# Patient Record
Sex: Female | Born: 1970 | Race: Black or African American | Hispanic: No | Smoking: Current every day smoker
Health system: Southern US, Community
[De-identification: ages and names within clinical notes are randomized; demographics above are authoritative.]

## PROBLEM LIST (undated history)

## (undated) DIAGNOSIS — M1990 Unspecified osteoarthritis, unspecified site: Secondary | ICD-10-CM

## (undated) DIAGNOSIS — E785 Hyperlipidemia, unspecified: Secondary | ICD-10-CM

## (undated) DIAGNOSIS — D509 Iron deficiency anemia, unspecified: Secondary | ICD-10-CM

## (undated) DIAGNOSIS — I10 Essential (primary) hypertension: Secondary | ICD-10-CM

## (undated) DIAGNOSIS — E119 Type 2 diabetes mellitus without complications: Secondary | ICD-10-CM

## (undated) HISTORY — PX: ABDOMINAL HYSTERECTOMY: SHX81

## (undated) HISTORY — PX: LAPAROSCOPIC GASTRIC BAND REMOVAL WITH LAPAROSCOPIC GASTRIC SLEEVE RESECTION: SHX6498

## (undated) HISTORY — PX: FOOT SURGERY: SHX648

---

## 2005-02-28 ENCOUNTER — Emergency Department: Payer: Self-pay | Admitting: Emergency Medicine

## 2006-08-21 ENCOUNTER — Emergency Department: Payer: Self-pay | Admitting: Emergency Medicine

## 2006-08-21 ENCOUNTER — Other Ambulatory Visit: Payer: Self-pay

## 2006-09-17 ENCOUNTER — Emergency Department: Payer: Self-pay

## 2006-11-01 ENCOUNTER — Emergency Department: Payer: Self-pay | Admitting: Emergency Medicine

## 2007-02-21 ENCOUNTER — Inpatient Hospital Stay: Payer: Self-pay | Admitting: Internal Medicine

## 2007-02-21 ENCOUNTER — Other Ambulatory Visit: Payer: Self-pay

## 2010-05-26 ENCOUNTER — Emergency Department: Payer: Self-pay | Admitting: Emergency Medicine

## 2010-10-07 ENCOUNTER — Inpatient Hospital Stay: Payer: Self-pay | Admitting: Surgery

## 2010-10-13 LAB — PATHOLOGY REPORT

## 2013-09-11 ENCOUNTER — Emergency Department: Payer: Self-pay | Admitting: Emergency Medicine

## 2013-09-11 LAB — URINALYSIS, COMPLETE
Bacteria: NONE SEEN
Bilirubin,UR: NEGATIVE
Blood: NEGATIVE
Ketone: NEGATIVE
Ph: 6 (ref 4.5–8.0)
Protein: NEGATIVE

## 2013-09-11 LAB — CBC
HCT: 39.4 % (ref 35.0–47.0)
HGB: 12.8 g/dL (ref 12.0–16.0)
MCH: 23.8 pg — ABNORMAL LOW (ref 26.0–34.0)
MCHC: 32.4 g/dL (ref 32.0–36.0)
MCV: 74 fL — ABNORMAL LOW (ref 80–100)
Platelet: 340 10*3/uL (ref 150–440)
RDW: 18.1 % — ABNORMAL HIGH (ref 11.5–14.5)
WBC: 10.7 10*3/uL (ref 3.6–11.0)

## 2013-09-11 LAB — COMPREHENSIVE METABOLIC PANEL
Alkaline Phosphatase: 90 U/L (ref 50–136)
BUN: 11 mg/dL (ref 7–18)
Bilirubin,Total: 0.2 mg/dL (ref 0.2–1.0)
Co2: 27 mmol/L (ref 21–32)
EGFR (African American): >60
Glucose: 115 mg/dL — ABNORMAL HIGH (ref 65–99)
Osmolality: 276 (ref 275–301)
SGOT(AST): 38 U/L — ABNORMAL HIGH (ref 15–37)
Sodium: 138 mmol/L (ref 136–145)

## 2013-09-11 LAB — LIPASE, BLOOD: Lipase: 208 U/L (ref 73–393)

## 2014-09-03 ENCOUNTER — Emergency Department: Payer: Self-pay | Admitting: Emergency Medicine

## 2015-05-20 ENCOUNTER — Other Ambulatory Visit: Payer: Self-pay

## 2015-05-20 ENCOUNTER — Other Ambulatory Visit (HOSPITAL_COMMUNITY)
Admission: RE | Admit: 2015-05-20 | Discharge: 2015-05-20 | Disposition: A | Discharge status: Home / Self Care | Payer: 59 | Source: Ambulatory Visit | Attending: Obstetrics & Gynecology | Admitting: Obstetrics & Gynecology

## 2015-05-20 ENCOUNTER — Other Ambulatory Visit: Payer: Self-pay | Admitting: Obstetrics & Gynecology

## 2015-05-20 DIAGNOSIS — Z1151 Encounter for screening for human papillomavirus (HPV): Secondary | ICD-10-CM | POA: Diagnosis present

## 2015-05-20 DIAGNOSIS — Z01419 Encounter for gynecological examination (general) (routine) without abnormal findings: Secondary | ICD-10-CM | POA: Diagnosis present

## 2015-05-20 DIAGNOSIS — Z1231 Encounter for screening mammogram for malignant neoplasm of breast: Secondary | ICD-10-CM

## 2015-05-21 LAB — CYTOLOGY - PAP

## 2015-05-29 ENCOUNTER — Ambulatory Visit
Admission: RE | Admit: 2015-05-29 | Discharge: 2015-05-29 | Disposition: A | Discharge status: Home / Self Care | Payer: 59 | Source: Ambulatory Visit

## 2015-05-29 DIAGNOSIS — Z1231 Encounter for screening mammogram for malignant neoplasm of breast: Secondary | ICD-10-CM

## 2016-05-14 ENCOUNTER — Other Ambulatory Visit: Payer: Self-pay | Admitting: Obstetrics & Gynecology

## 2016-05-14 DIAGNOSIS — Z1231 Encounter for screening mammogram for malignant neoplasm of breast: Secondary | ICD-10-CM

## 2016-05-28 ENCOUNTER — Other Ambulatory Visit: Payer: Self-pay | Admitting: Obstetrics & Gynecology

## 2016-05-31 ENCOUNTER — Ambulatory Visit
Admission: RE | Admit: 2016-05-31 | Discharge: 2016-05-31 | Disposition: A | Discharge status: Home / Self Care | Payer: 59 | Source: Ambulatory Visit | Attending: Obstetrics & Gynecology | Admitting: Obstetrics & Gynecology

## 2016-05-31 DIAGNOSIS — Z1231 Encounter for screening mammogram for malignant neoplasm of breast: Secondary | ICD-10-CM

## 2016-09-06 ENCOUNTER — Ambulatory Visit: Payer: 59 | Admitting: Cardiovascular Disease

## 2016-09-06 ENCOUNTER — Encounter: Payer: Self-pay | Admitting: *Deleted

## 2017-05-12 ENCOUNTER — Other Ambulatory Visit: Payer: Self-pay | Admitting: Obstetrics & Gynecology

## 2017-05-12 DIAGNOSIS — Z1231 Encounter for screening mammogram for malignant neoplasm of breast: Secondary | ICD-10-CM

## 2017-06-01 ENCOUNTER — Ambulatory Visit: Payer: 59

## 2017-06-01 ENCOUNTER — Ambulatory Visit
Admission: RE | Admit: 2017-06-01 | Discharge: 2017-06-01 | Disposition: A | Discharge status: Home / Self Care | Payer: 59 | Source: Ambulatory Visit | Attending: Obstetrics & Gynecology | Admitting: Obstetrics & Gynecology

## 2017-06-01 DIAGNOSIS — Z1231 Encounter for screening mammogram for malignant neoplasm of breast: Secondary | ICD-10-CM

## 2018-03-15 ENCOUNTER — Encounter: Payer: 59 | Attending: Family Medicine | Admitting: Dietician

## 2018-03-15 ENCOUNTER — Encounter: Payer: Self-pay | Admitting: Dietician

## 2018-03-15 VITALS — Ht 69.0 in | Wt >= 6400 oz

## 2018-03-15 DIAGNOSIS — I10 Essential (primary) hypertension: Secondary | ICD-10-CM | POA: Diagnosis not present

## 2018-03-15 DIAGNOSIS — Z6841 Body Mass Index (BMI) 40.0 and over, adult: Secondary | ICD-10-CM | POA: Diagnosis not present

## 2018-03-15 DIAGNOSIS — Z713 Dietary counseling and surveillance: Secondary | ICD-10-CM | POA: Diagnosis not present

## 2018-03-15 DIAGNOSIS — R739 Hyperglycemia, unspecified: Secondary | ICD-10-CM | POA: Insufficient documentation

## 2018-03-15 NOTE — Patient Instructions (Signed)
   Work on controlling portions of starchy foods-- keep to 1 cup (fist-size) or less. Try measuring portions, using smaller plates, eat slowly, take small bites.   Eat generous portions of low-carb vegetables, and some fruits.   Keep portion of nuts/ trail mix to 1/4 cup ideally. Use fruit, yogurt, lowfat cheese, whole grain crackers/ graham crackers for snacks. Pre-portion snacks and put the rest away.   Eat meals on a more consistent basis. Keep easy, healthy options on hand for busy days.

## 2018-03-15 NOTE — Progress Notes (Signed)
Medical Nutrition Therapy: Visit start time: 1630  end time: 1800  Assessment:  Diagnosis: obesity, hyperglycemia, HTN Past medical history: chronic knee pain, needs knee replacement per patient Psychosocial issues/ stress concerns: high level of personal stress caring for visually impaired mother, 3 young grandchildren and son living in home; working 2 jobs  Preferred learning method:  [place] . Hands-on  Current weight: 409.5lbs Height: 5'9" Medications, supplements: reconciled list in medical record  Progress and evaluation: Patient has worked on weight loss about 48yrs ago by exercise, avoiding restaurant meals --until hurting knee after 5K, and has since regained weight. She reports a very busy schedule in helping raise 3 grandchildren with her son, and mother, + working 2 jobs. She needs to lose weight in order to have knee replacement surgery, and is considering weight loss surgery if she can get insurance coverage. She seeks help with appropriate weight loss diet that can work for her lifestyle without having to cook multiple meals for family members.     Physical activity: none recently due to knee pain and busy schedule  Dietary Intake:  Usual eating pattern includes 2-3 meals and 1-2 snacks per day. Dining out frequency: 4-5 meals per week.  Breakfast: 2 boiled eggs, occ with small amount mayo, 2 slices bacon, banana, plain oatmeal with small amount honey; sometimes skips; sometimes cereal or chips--on the go.  Snack: usually none Lunch: sometimes skips; leftovers; sandwich; lean cuisine meal Snack: trail mix with m&ms, peanuts, peanut butter chips, raisins; chips and dip; tortilla chips and salsa Supper: spaghetti; baked chicken breast; hot dogs, burger; pizza -- cooks for mother, grandchildren Snack: sometimes (same foods as pm snack) Beverages: water, 1c coffee with sugar, creamer; sweet tea 1-2x a week; occasional soda (does not like diet)  Nutrition Care Education: Topics  covered: weight control, diabetes prevention, HTN Basic nutrition: basic food groups, appropriate nutrient balance, appropriate meal and snack schedule, general nutrition guidelines    Weight control: appropriate kcal intake for weight loss estimated at 1700-1800kcal-- provided guidance for food portions, basic meal planning using plate method, and balanced meal options; importance of low fat and low sugar choices; increasing vegetable and fruit intake; healthy snack options; benefits of tracking food intake; role of exercise/ physical activity Diabetes:  appropriate meal and snack schedule, appropriate carb intake and balance Hypertension: identifying high sodium foods, identifying food sources of potassium, magnesium; low sodium seasoning options   Nutritional Diagnosis:  [place]-2.2 Altered nutrition-related laboratory As related to hyperglycemia.  As evidenced by recent HbA1C of 6.8%. [place]-3.3 Overweight/obesity As related to excess calories and inactivity due to knee pain.  As evidenced by patient with BMI of 60, diet history of large food portions.  Intervention: Instruction as noted above.   Patient has begun making diet changes to decrease intake of sugar-sweetened beverages, fast foods, and fried foods.    Set additional diet goals with direction from patient.        Education Materials given:  . Plate Planner with food lists . Sample meal pattern/ menus . Snacking handout . Goals/ instructions  Learner/ who was taught:  . Patient   Level of understanding: Marland Kitchen Verbalizes/ demonstrates competency  Demonstrated degree of understanding via:   Teach back Learning barriers: . None  Willingness to learn/ readiness for change: . Eager, change in progress  Monitoring and Evaluation:  Dietary intake, exercise, and body weight      follow up: 04/26/18

## 2018-04-24 ENCOUNTER — Other Ambulatory Visit: Payer: Self-pay | Admitting: Obstetrics & Gynecology

## 2018-04-24 DIAGNOSIS — Z1231 Encounter for screening mammogram for malignant neoplasm of breast: Secondary | ICD-10-CM

## 2018-04-26 ENCOUNTER — Ambulatory Visit: Payer: 59 | Admitting: Dietician

## 2018-05-05 ENCOUNTER — Telehealth: Payer: Self-pay | Admitting: Dietician

## 2018-05-05 NOTE — Telephone Encounter (Signed)
Called patient to reschedule appointment which was missed on 04/26/18. Rescheduled for 06/05/18 at 4:30pm. Patient reports making some diet changes and doing well at this time.

## 2018-06-05 ENCOUNTER — Ambulatory Visit: Payer: 59 | Admitting: Dietician

## 2018-06-05 ENCOUNTER — Ambulatory Visit
Admission: RE | Admit: 2018-06-05 | Discharge: 2018-06-05 | Disposition: A | Discharge status: Home / Self Care | Payer: 59 | Source: Ambulatory Visit | Attending: Obstetrics & Gynecology | Admitting: Obstetrics & Gynecology

## 2018-06-05 ENCOUNTER — Telehealth: Payer: Self-pay | Admitting: Dietician

## 2018-06-05 DIAGNOSIS — Z1231 Encounter for screening mammogram for malignant neoplasm of breast: Secondary | ICD-10-CM

## 2018-06-05 NOTE — Telephone Encounter (Signed)
Called patient to reschedule her appointment from today to another time due to scheduling conflict in our office. She rescheduled to 06/14/18 at 4:30pm.

## 2018-06-14 ENCOUNTER — Ambulatory Visit: Payer: 59 | Admitting: Dietician

## 2018-06-20 ENCOUNTER — Encounter: Payer: Self-pay | Admitting: Dietician

## 2018-06-20 NOTE — Progress Notes (Signed)
Patient has missed 3 consecutively scheduled appointments. Sent discharge letter to referring provider.

## 2019-02-20 ENCOUNTER — Other Ambulatory Visit: Payer: Self-pay | Admitting: Family Medicine

## 2019-02-20 DIAGNOSIS — R7989 Other specified abnormal findings of blood chemistry: Secondary | ICD-10-CM

## 2019-02-20 DIAGNOSIS — R945 Abnormal results of liver function studies: Principal | ICD-10-CM

## 2019-03-02 ENCOUNTER — Ambulatory Visit
Admission: RE | Admit: 2019-03-02 | Discharge: 2019-03-02 | Disposition: A | Discharge status: Home / Self Care | Payer: 59 | Source: Ambulatory Visit | Attending: Family Medicine | Admitting: Family Medicine

## 2019-03-02 ENCOUNTER — Other Ambulatory Visit: Payer: Self-pay

## 2019-03-02 DIAGNOSIS — R945 Abnormal results of liver function studies: Secondary | ICD-10-CM | POA: Insufficient documentation

## 2019-03-02 DIAGNOSIS — R7989 Other specified abnormal findings of blood chemistry: Secondary | ICD-10-CM

## 2019-04-27 ENCOUNTER — Other Ambulatory Visit: Payer: Self-pay | Admitting: Obstetrics & Gynecology

## 2019-04-27 DIAGNOSIS — Z1231 Encounter for screening mammogram for malignant neoplasm of breast: Secondary | ICD-10-CM

## 2019-06-12 ENCOUNTER — Other Ambulatory Visit: Payer: Self-pay

## 2019-06-12 ENCOUNTER — Ambulatory Visit
Admission: RE | Admit: 2019-06-12 | Discharge: 2019-06-12 | Disposition: A | Discharge status: Home / Self Care | Payer: 59 | Source: Ambulatory Visit | Attending: Obstetrics & Gynecology | Admitting: Obstetrics & Gynecology

## 2019-06-12 DIAGNOSIS — Z1231 Encounter for screening mammogram for malignant neoplasm of breast: Secondary | ICD-10-CM

## 2019-09-17 ENCOUNTER — Other Ambulatory Visit: Payer: Self-pay | Admitting: Surgical Oncology

## 2019-09-17 DIAGNOSIS — K219 Gastro-esophageal reflux disease without esophagitis: Secondary | ICD-10-CM

## 2019-09-24 ENCOUNTER — Ambulatory Visit
Admission: RE | Admit: 2019-09-24 | Discharge: 2019-09-24 | Disposition: A | Discharge status: Home / Self Care | Payer: 59 | Source: Ambulatory Visit | Attending: Surgical Oncology | Admitting: Surgical Oncology

## 2019-09-24 DIAGNOSIS — K219 Gastro-esophageal reflux disease without esophagitis: Secondary | ICD-10-CM

## 2020-05-20 ENCOUNTER — Other Ambulatory Visit: Payer: Self-pay | Admitting: Family Medicine

## 2020-05-20 DIAGNOSIS — Z1231 Encounter for screening mammogram for malignant neoplasm of breast: Secondary | ICD-10-CM

## 2020-06-13 ENCOUNTER — Ambulatory Visit
Admission: RE | Admit: 2020-06-13 | Discharge: 2020-06-13 | Disposition: A | Discharge status: Home / Self Care | Payer: 59 | Source: Ambulatory Visit | Attending: Family Medicine | Admitting: Family Medicine

## 2020-06-13 ENCOUNTER — Other Ambulatory Visit: Payer: Self-pay

## 2020-06-13 DIAGNOSIS — Z1231 Encounter for screening mammogram for malignant neoplasm of breast: Secondary | ICD-10-CM

## 2020-07-14 ENCOUNTER — Other Ambulatory Visit
Admission: RE | Admit: 2020-07-14 | Discharge: 2020-07-14 | Disposition: A | Discharge status: Home / Self Care | Payer: 59 | Source: Ambulatory Visit | Attending: Internal Medicine | Admitting: Internal Medicine

## 2020-07-14 ENCOUNTER — Other Ambulatory Visit: Payer: Self-pay

## 2020-07-14 DIAGNOSIS — Z01812 Encounter for preprocedural laboratory examination: Secondary | ICD-10-CM | POA: Insufficient documentation

## 2020-07-14 DIAGNOSIS — Z20822 Contact with and (suspected) exposure to covid-19: Secondary | ICD-10-CM | POA: Insufficient documentation

## 2020-07-14 LAB — SARS CORONAVIRUS 2 (TAT 6-24 HRS): SARS Coronavirus 2: NEGATIVE

## 2020-07-15 ENCOUNTER — Encounter: Payer: Self-pay | Admitting: Internal Medicine

## 2020-07-16 ENCOUNTER — Encounter
Admission: RE | Disposition: A | Discharge status: Home / Self Care | Payer: Self-pay | Source: Home / Self Care | Attending: Internal Medicine

## 2020-07-16 ENCOUNTER — Ambulatory Visit: Payer: 59 | Admitting: Anesthesiology

## 2020-07-16 ENCOUNTER — Other Ambulatory Visit: Payer: Self-pay

## 2020-07-16 ENCOUNTER — Ambulatory Visit
Admission: RE | Admit: 2020-07-16 | Discharge: 2020-07-16 | Disposition: A | Discharge status: Home / Self Care | Payer: 59 | Attending: Internal Medicine | Admitting: Internal Medicine

## 2020-07-16 ENCOUNTER — Encounter: Payer: Self-pay | Admitting: Internal Medicine

## 2020-07-16 DIAGNOSIS — Z1211 Encounter for screening for malignant neoplasm of colon: Secondary | ICD-10-CM | POA: Insufficient documentation

## 2020-07-16 DIAGNOSIS — Z79899 Other long term (current) drug therapy: Secondary | ICD-10-CM | POA: Insufficient documentation

## 2020-07-16 DIAGNOSIS — Z6841 Body Mass Index (BMI) 40.0 and over, adult: Secondary | ICD-10-CM | POA: Insufficient documentation

## 2020-07-16 DIAGNOSIS — F17200 Nicotine dependence, unspecified, uncomplicated: Secondary | ICD-10-CM | POA: Diagnosis not present

## 2020-07-16 DIAGNOSIS — D127 Benign neoplasm of rectosigmoid junction: Secondary | ICD-10-CM | POA: Insufficient documentation

## 2020-07-16 DIAGNOSIS — Z7984 Long term (current) use of oral hypoglycemic drugs: Secondary | ICD-10-CM | POA: Diagnosis not present

## 2020-07-16 DIAGNOSIS — Z8371 Family history of colonic polyps: Secondary | ICD-10-CM | POA: Diagnosis not present

## 2020-07-16 DIAGNOSIS — Z9884 Bariatric surgery status: Secondary | ICD-10-CM | POA: Diagnosis not present

## 2020-07-16 DIAGNOSIS — E785 Hyperlipidemia, unspecified: Secondary | ICD-10-CM | POA: Diagnosis not present

## 2020-07-16 DIAGNOSIS — K5730 Diverticulosis of large intestine without perforation or abscess without bleeding: Secondary | ICD-10-CM | POA: Diagnosis not present

## 2020-07-16 DIAGNOSIS — K640 First degree hemorrhoids: Secondary | ICD-10-CM | POA: Insufficient documentation

## 2020-07-16 DIAGNOSIS — J449 Chronic obstructive pulmonary disease, unspecified: Secondary | ICD-10-CM | POA: Insufficient documentation

## 2020-07-16 DIAGNOSIS — M1990 Unspecified osteoarthritis, unspecified site: Secondary | ICD-10-CM | POA: Insufficient documentation

## 2020-07-16 DIAGNOSIS — I10 Essential (primary) hypertension: Secondary | ICD-10-CM | POA: Diagnosis not present

## 2020-07-16 DIAGNOSIS — Z791 Long term (current) use of non-steroidal anti-inflammatories (NSAID): Secondary | ICD-10-CM | POA: Diagnosis not present

## 2020-07-16 DIAGNOSIS — E119 Type 2 diabetes mellitus without complications: Secondary | ICD-10-CM | POA: Diagnosis not present

## 2020-07-16 DIAGNOSIS — Z888 Allergy status to other drugs, medicaments and biological substances status: Secondary | ICD-10-CM | POA: Insufficient documentation

## 2020-07-16 HISTORY — PX: COLONOSCOPY WITH PROPOFOL: SHX5780

## 2020-07-16 HISTORY — DX: Morbid (severe) obesity due to excess calories: E66.01

## 2020-07-16 HISTORY — DX: Type 2 diabetes mellitus without complications: E11.9

## 2020-07-16 HISTORY — DX: Essential (primary) hypertension: I10

## 2020-07-16 HISTORY — DX: Iron deficiency anemia, unspecified: D50.9

## 2020-07-16 HISTORY — DX: Unspecified osteoarthritis, unspecified site: M19.90

## 2020-07-16 HISTORY — DX: Hyperlipidemia, unspecified: E78.5

## 2020-07-16 LAB — GLUCOSE, CAPILLARY: Glucose-Capillary: 83 mg/dL (ref 70–99)

## 2020-07-16 SURGERY — COLONOSCOPY WITH PROPOFOL
Anesthesia: General

## 2020-07-16 MED ORDER — LIDOCAINE HCL (CARDIAC) PF 100 MG/5ML IV SOSY
PREFILLED_SYRINGE | INTRAVENOUS | Status: DC | PRN
  Administered 2020-07-16: 50 mg via INTRAVENOUS

## 2020-07-16 MED ORDER — LIDOCAINE HCL (PF) 2 % IJ SOLN
INTRAMUSCULAR | Status: AC
  Filled 2020-07-16: qty 5

## 2020-07-16 MED ORDER — PROPOFOL 500 MG/50ML IV EMUL
INTRAVENOUS | Status: DC | PRN
  Administered 2020-07-16: 130 ug/kg/min via INTRAVENOUS

## 2020-07-16 MED ORDER — SODIUM CHLORIDE 0.9 % IV SOLN: INTRAVENOUS | Status: DC

## 2020-07-16 MED ORDER — PROPOFOL 500 MG/50ML IV EMUL
INTRAVENOUS | Status: AC
  Filled 2020-07-16: qty 50

## 2020-07-16 MED ORDER — PROPOFOL 10 MG/ML IV BOLUS
INTRAVENOUS | Status: DC | PRN
  Administered 2020-07-16: 80 mg via INTRAVENOUS
  Administered 2020-07-16: 16 mg via INTRAVENOUS

## 2020-07-16 NOTE — Interval H&P Note (Signed)
History and Physical Interval Note:  07/16/2020 10:03 AM  Sonia Schultz  has presented today for surgery, with the diagnosis of FAMILY HX.OF COLON POLYPS-MOTHER.  The various methods of treatment have been discussed with the patient and family. After consideration of risks, benefits and other options for treatment, the patient has consented to  Procedure(s): COLONOSCOPY WITH PROPOFOL (N/A) as a surgical intervention.  The patient's history has been reviewed, patient examined, no change in status, stable for surgery.  I have reviewed the patient's chart and labs.  Questions were answered to the patient's satisfaction.     [place], [place]

## 2020-07-16 NOTE — H&P (Signed)
  Outpatient short stay form Pre-procedure 07/16/2020 10:03 AM Sonia Schultz K. [name], M.D.  Primary Physician: Salome Holmes, M.D.  Reason for visit:  Colon cancer screening  History of present illness:  Patient presents for colonoscopy for colon cancer screening. The patient denies complaints of abdominal pain, significant change in bowel habits, or rectal bleeding.      Current Facility-Administered Medications:  .  0.9 %  sodium chloride infusion, , Intravenous, Continuous, Matheny, Benay Pike, MD, Last Rate: 20 mL/hr at 07/16/20 0906, New Bag at 07/16/20 0906  Medications Prior to Admission  Medication Sig Dispense Refill Last Dose  . acetaminophen (TYLENOL) 325 MG tablet Take 650 mg by mouth every 6 (six) hours as needed.   07/15/2020 at Unknown time  . amLODipine (NORVASC) 5 MG tablet Take 5 mg by mouth daily.     Marland Kitchen atorvastatin (LIPITOR) 20 MG tablet Take 20 mg by mouth daily.   07/15/2020 at Unknown time  . lisinopril (PRINIVIL,ZESTRIL) 40 MG tablet   11 07/16/2020 at Unknown time  . metFORMIN (GLUCOPHAGE) 500 MG tablet Take by mouth 2 (two) times daily with a meal.   07/15/2020 at Unknown time  . metoprolol succinate (TOPROL-XL) 25 MG 24 hr tablet   11 07/16/2020 at Unknown time  . naproxen sodium (ALEVE) 220 MG tablet Take 220 mg by mouth.   07/15/2020 at Unknown time  . omeprazole (PRILOSEC) 20 MG capsule Take 20 mg by mouth daily.   07/15/2020 at Unknown time  . sennosides-docusate sodium (SENOKOT-S) 8.6-50 MG tablet Take 1 tablet by mouth daily.   Past Week at Unknown time  . ursodiol (ACTIGALL) 250 MG tablet Take 250 mg by mouth 3 (three) times daily.   07/15/2020 at Unknown time  . diclofenac sodium (VOLTAREN) 1 % GEL Voltaren 1 % topical gel  APP 2 GRAMS TOPICALLY AA QID     . metFORMIN (GLUCOPHAGE) 500 MG tablet Take by mouth.     . Vitamin D, Ergocalciferol, (DRISDOL) 50000 units CAPS capsule TK 1 C PO ONCE A WK  0      Allergies  Allergen Reactions  . Cinnamon Itching and  Shortness Of Breath  . Rofecoxib Swelling  . Ginger Itching  . Hydrochlorothiazide Palpitations     Past Medical History:  Diagnosis Date  . Diabetes mellitus without complication ([place])   . Hyperlipidemia   . Hypertension   . Iron deficiency anemia   . Morbid obesity with body mass index (BMI) of 50.0 to 59.9 in adult [facility])   . Osteoarthritis     Review of systems:  Otherwise negative.    Physical Exam  Gen: Alert, oriented. Appears stated age.  HEENT: /AT. PERRLA. Lungs: CTA, no wheezes. CV: RR nl S1, S2. Abd: soft, benign, no masses. BS+ Ext: No edema. Pulses 2+    Planned procedures: Proceed with colonoscopy. The patient understands the nature of the planned procedure, indications, risks, alternatives and potential complications including but not limited to bleeding, infection, perforation, damage to internal organs and possible oversedation/side effects from anesthesia. The patient agrees and gives consent to proceed.  Please refer to procedure notes for findings, recommendations and patient disposition/instructions.     [name] K. [name], M.D. Gastroenterology 07/16/2020  10:03 AM

## 2020-07-16 NOTE — Transfer of Care (Signed)
Immediate Anesthesia Transfer of Care Note  Patient: Sonia Schultz  Procedure(s) Performed: COLONOSCOPY WITH PROPOFOL (N/A )  Patient Location: PACU and Endoscopy Unit  Anesthesia Type:General  Level of Consciousness: drowsy  Airway & Oxygen Therapy: Patient Spontanous Breathing  Post-op Assessment: Report given to RN and Post -op Vital signs reviewed and stable  Post vital signs: Reviewed and stable  Last Vitals:  Vitals Value Taken Time  BP 133/80 07/16/20 1039  Temp    Pulse 76 07/16/20 1040  Resp 16 07/16/20 1040  SpO2 97 % 07/16/20 1040  Vitals shown include unvalidated device data.  Last Pain:  Vitals:   07/16/20 1030  TempSrc: Oral  PainSc:          Complications: No complications documented.

## 2020-07-16 NOTE — Interval H&P Note (Signed)
History and Physical Interval Note:  07/16/2020 10:11 AM  Sonia Schultz  has presented today for surgery, with the diagnosis of FAMILY HX.OF COLON POLYPS-MOTHER.  The various methods of treatment have been discussed with the patient and family. After consideration of risks, benefits and other options for treatment, the patient has consented to  Procedure(s): COLONOSCOPY WITH PROPOFOL (N/A) as a surgical intervention.  The patient's history has been reviewed, patient examined, no change in status, stable for surgery.  I have reviewed the patient's chart and labs.  Questions were answered to the patient's satisfaction.     [name], [name]

## 2020-07-16 NOTE — Anesthesia Postprocedure Evaluation (Signed)
Anesthesia Post Note  Patient: Sonia Schultz  Procedure(s) Performed: COLONOSCOPY WITH PROPOFOL (N/A )  Patient location during evaluation: Endoscopy Anesthesia Type: General Level of consciousness: awake and alert Pain management: pain level controlled Vital Signs Assessment: post-procedure vital signs reviewed and stable Respiratory status: spontaneous breathing, nonlabored ventilation, respiratory function stable and patient connected to nasal cannula oxygen Cardiovascular status: blood pressure returned to baseline and stable Postop Assessment: no apparent nausea or vomiting Anesthetic complications: no   No complications documented.   Last Vitals:  Vitals:   07/16/20 1030 07/16/20 1050  BP: 133/80 130/84  Pulse: 77 77  Resp: 16 20  Temp: (!) 36.1 C   SpO2: 100% 98%    Last Pain:  Vitals:   07/16/20 1030  TempSrc: Oral  PainSc:                  Precious Haws Genessa Beman

## 2020-07-16 NOTE — Anesthesia Preprocedure Evaluation (Signed)
Anesthesia Evaluation  Patient identified by MRN, date of birth, ID band Patient awake    Reviewed: Allergy & Precautions, H&P , NPO status , Patient's Chart, lab work & pertinent test results  History of Anesthesia Complications Negative for: history of anesthetic complications  Airway Mallampati: III  TM Distance: <3 FB Neck ROM: full    Dental  (+) Chipped, Poor Dentition, Missing   Pulmonary COPD, Current Smoker and Patient abstained from smoking.,    Pulmonary exam normal        Cardiovascular hypertension, Normal cardiovascular exam+ dysrhythmias      Neuro/Psych negative neurological ROS  negative psych ROS   GI/Hepatic negative GI ROS, Neg liver ROS, neg GERD  ,  Endo/Other  diabetes, Type 2, Insulin Dependent  Renal/GU negative Renal ROS  negative genitourinary   Musculoskeletal   Abdominal   Peds  Hematology negative hematology ROS (+)   Anesthesia Other Findings Past Medical History: No date: Diabetes mellitus without complication (HCC) No date: Hyperlipidemia No date: Hypertension No date: Iron deficiency anemia No date: Morbid obesity with body mass index (BMI) of 50.0 to 59.9 in  adult [facility]) No date: Osteoarthritis  Past Surgical History: No date: ABDOMINAL HYSTERECTOMY No date: FOOT SURGERY; Right No date: LAPAROSCOPIC GASTRIC BAND REMOVAL WITH LAPAROSCOPIC GASTRIC  SLEEVE RESECTION  BMI    Body Mass Index: 60.47 kg/m      Reproductive/Obstetrics negative OB ROS                             Anesthesia Physical Anesthesia Plan  ASA: III  Anesthesia Plan: General   Post-op Pain Management:    Induction: Intravenous  PONV Risk Score and Plan: Propofol infusion and TIVA  Airway Management Planned: Natural Airway and Nasal Cannula  Additional Equipment:   Intra-op Plan:   Post-operative Plan:   Informed Consent: I have reviewed the patients History  and Physical, chart, labs and discussed the procedure including the risks, benefits and alternatives for the proposed anesthesia with the patient or authorized representative who has indicated his/her understanding and acceptance.     Dental Advisory Given  Plan Discussed with: Anesthesiologist, CRNA and Surgeon  Anesthesia Plan Comments: (Patient consented for risks of anesthesia including but not limited to:  - adverse reactions to medications - risk of intubation if required - damage to eyes, teeth, lips or other oral mucosa - nerve damage due to positioning  - sore throat or hoarseness - Damage to heart, brain, nerves, lungs, other parts of body or loss of life  Patient voiced understanding.)        Anesthesia Quick Evaluation

## 2020-07-16 NOTE — Op Note (Signed)
[facility] Gastroenterology Patient Name: Sonia Schultz Procedure Date: 07/16/2020 10:06 AM MRN: [id number] Account #: [id number] Date of Birth: 07-17-1971 Admit Type: Outpatient Age: 49 Room: [facility] ENDO ROOM 3 Gender: Female Note Status: Finalized Procedure:             Colonoscopy Indications:           Screening for colorectal malignant neoplasm Providers:             Benay Pike. Amand Lemoine MD, MD Medicines:             Propofol per Anesthesia Complications:         No immediate complications. Procedure:             Pre-Anesthesia Assessment:                        - The risks and benefits of the procedure and the                         sedation options and risks were discussed with the                         patient. All questions were answered and informed                         consent was obtained.                        - The risks and benefits of the procedure and the                         sedation options and risks were discussed with the                         patient. All questions were answered and informed                         consent was obtained.                        - Patient identification and proposed procedure were                         verified prior to the procedure by the nurse. The                         procedure was verified in the procedure room.                        - ASA Grade Assessment: III - A patient with severe                         systemic disease.                        - After reviewing the risks and benefits, the patient                         was deemed in satisfactory condition to undergo the  procedure.                        After obtaining informed consent, the colonoscope was                         passed under direct vision. Throughout the procedure,                         the patient's blood pressure, pulse, and oxygen                         saturations were monitored  continuously. The                         Colonoscope was introduced through the anus and                         advanced to the the cecum, identified by appendiceal                         orifice and ileocecal valve. The colonoscopy was                         somewhat difficult due to a redundant colon.                         Successful completion of the procedure was aided by                         withdrawing and reinserting the scope. The patient                         tolerated the procedure well. The quality of the bowel                         preparation was adequate. The ileocecal valve,                         appendiceal orifice, and rectum were photographed. Findings:      The perianal and digital rectal examinations were normal. Pertinent       negatives include normal sphincter tone and no palpable rectal lesions.      Non-bleeding internal hemorrhoids were found during retroflexion. The       hemorrhoids were Grade I (internal hemorrhoids that do not prolapse).      Many small and large-mouthed diverticula were found in the entire colon.      Five sessile polyps were found in the recto-sigmoid colon. The polyps       were 3 to 5 mm in size. These polyps were removed with a cold snare.       Resection and retrieval were complete.      Two sessile polyps were found in the recto-sigmoid colon. The polyps       were 4 to 5 mm in size. These polyps were removed with a jumbo cold       forceps. Resection and retrieval were complete.      The exam was otherwise without abnormality. Impression:            -  Non-bleeding internal hemorrhoids.                        - Diverticulosis in the entire examined colon.                        - Five 3 to 5 mm polyps at the recto-sigmoid colon,                         removed with a cold snare. Resected and retrieved.                        - Two 4 to 5 mm polyps at the recto-sigmoid colon,                         removed with a jumbo  cold forceps. Resected and                         retrieved.                        - The examination was otherwise normal. Recommendation:        - Patient has a contact number available for                         emergencies. The signs and symptoms of potential                         delayed complications were discussed with the patient.                         Return to normal activities tomorrow. Written                         discharge instructions were provided to the patient.                        - Resume previous diet.                        - Continue present medications.                        - Repeat colonoscopy date to be determined after                         pending pathology results are reviewed for                         surveillance.                        - Return to GI office PRN.                        - The findings and recommendations were discussed with                         the patient. Procedure Code(s):     --- Professional ---  45385, Colonoscopy, flexible; with removal of                         tumor(s), polyp(s), or other lesion(s) by snare                         technique                        45380, 59, Colonoscopy, flexible; with biopsy, single                         or multiple Diagnosis Code(s):     --- Professional ---                        K57.30, Diverticulosis of large intestine without                         perforation or abscess without bleeding                        K63.5, Polyp of colon                        K64.0, First degree hemorrhoids                        Z12.11, Encounter for screening for malignant neoplasm                         of colon CPT copyright 2019 American Medical Association. All rights reserved. The codes documented in this report are preliminary and upon coder review may  be revised to meet current compliance requirements. Efrain Sella MD, MD 07/16/2020 11:23:56 AM This  report has been signed electronically. Number of Addenda: 0 Note Initiated On: 07/16/2020 10:06 AM Scope Withdrawal Time: 0 hours 5 minutes 38 seconds  Total Procedure Duration: 0 hours 17 minutes 31 seconds  Estimated Blood Loss:  Estimated blood loss: none.      [facility]

## 2020-07-17 ENCOUNTER — Encounter: Payer: Self-pay | Admitting: Internal Medicine

## 2020-07-17 LAB — SURGICAL PATHOLOGY

## 2021-04-27 ENCOUNTER — Encounter: Payer: Self-pay | Admitting: Family Medicine

## 2021-05-06 ENCOUNTER — Other Ambulatory Visit: Payer: Self-pay | Admitting: Family Medicine

## 2021-05-06 DIAGNOSIS — Z1231 Encounter for screening mammogram for malignant neoplasm of breast: Secondary | ICD-10-CM

## 2021-07-01 ENCOUNTER — Ambulatory Visit
Admission: RE | Admit: 2021-07-01 | Discharge: 2021-07-01 | Disposition: A | Discharge status: Home / Self Care | Payer: 59 | Source: Ambulatory Visit | Attending: Family Medicine | Admitting: Family Medicine

## 2021-07-01 ENCOUNTER — Other Ambulatory Visit: Payer: Self-pay

## 2021-07-01 DIAGNOSIS — Z1231 Encounter for screening mammogram for malignant neoplasm of breast: Secondary | ICD-10-CM

## 2022-05-20 ENCOUNTER — Other Ambulatory Visit: Payer: Self-pay | Admitting: Family Medicine

## 2022-05-20 DIAGNOSIS — Z1231 Encounter for screening mammogram for malignant neoplasm of breast: Secondary | ICD-10-CM

## 2022-07-02 ENCOUNTER — Ambulatory Visit
Admission: RE | Admit: 2022-07-02 | Discharge: 2022-07-02 | Disposition: A | Discharge status: Home / Self Care | Payer: 59 | Source: Ambulatory Visit | Attending: Family Medicine | Admitting: Family Medicine

## 2022-07-02 DIAGNOSIS — Z1231 Encounter for screening mammogram for malignant neoplasm of breast: Secondary | ICD-10-CM

## 2022-10-28 IMAGING — MG MM DIGITAL SCREENING BILAT W/ TOMO AND CAD
6 of 12 series · 6 of 36 positions shown · non-contrast
Comparison: Previous exam(s).

CLINICAL DATA: Screening.

EXAM:
DIGITAL SCREENING BILATERAL MAMMOGRAM WITH TOMOSYNTHESIS AND CAD
TECHNIQUE: Bilateral screening digital craniocaudal and mediolateral oblique
mammograms were obtained. Bilateral screening digital breast
tomosynthesis was performed. The images were evaluated with
computer-aided detection.

[L CC synth-2D (1 of 2)]
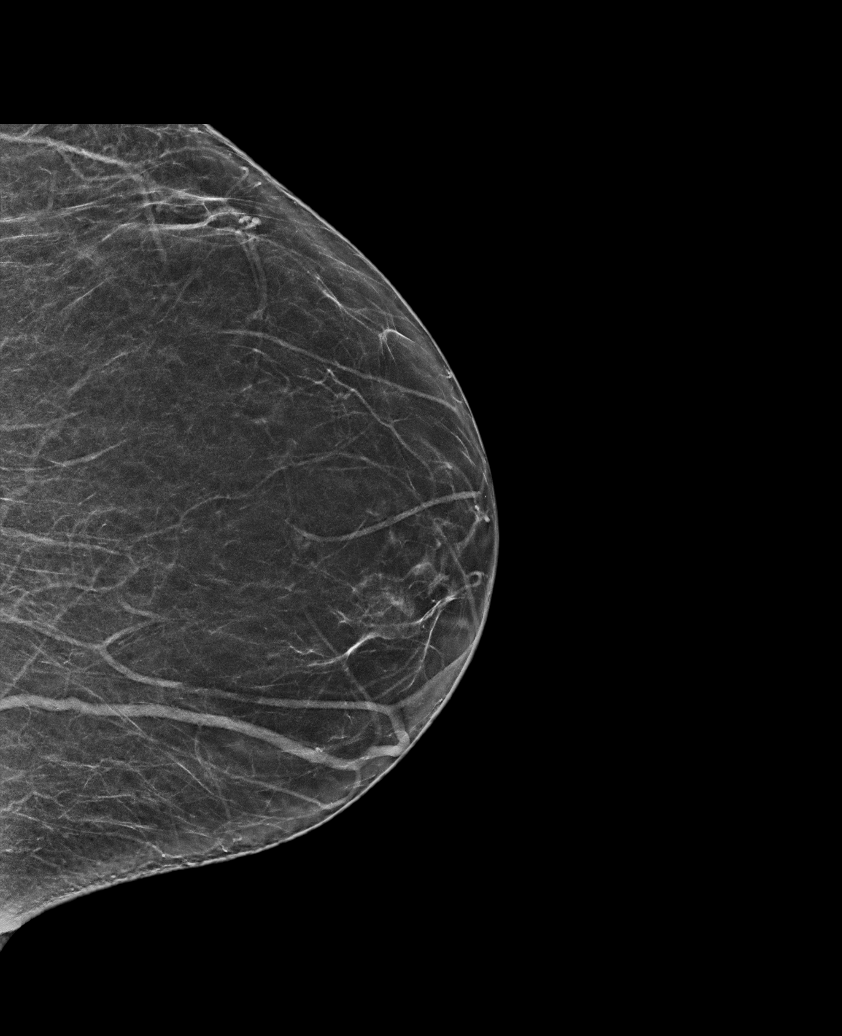

[L CC synth-2D (2 of 2)]
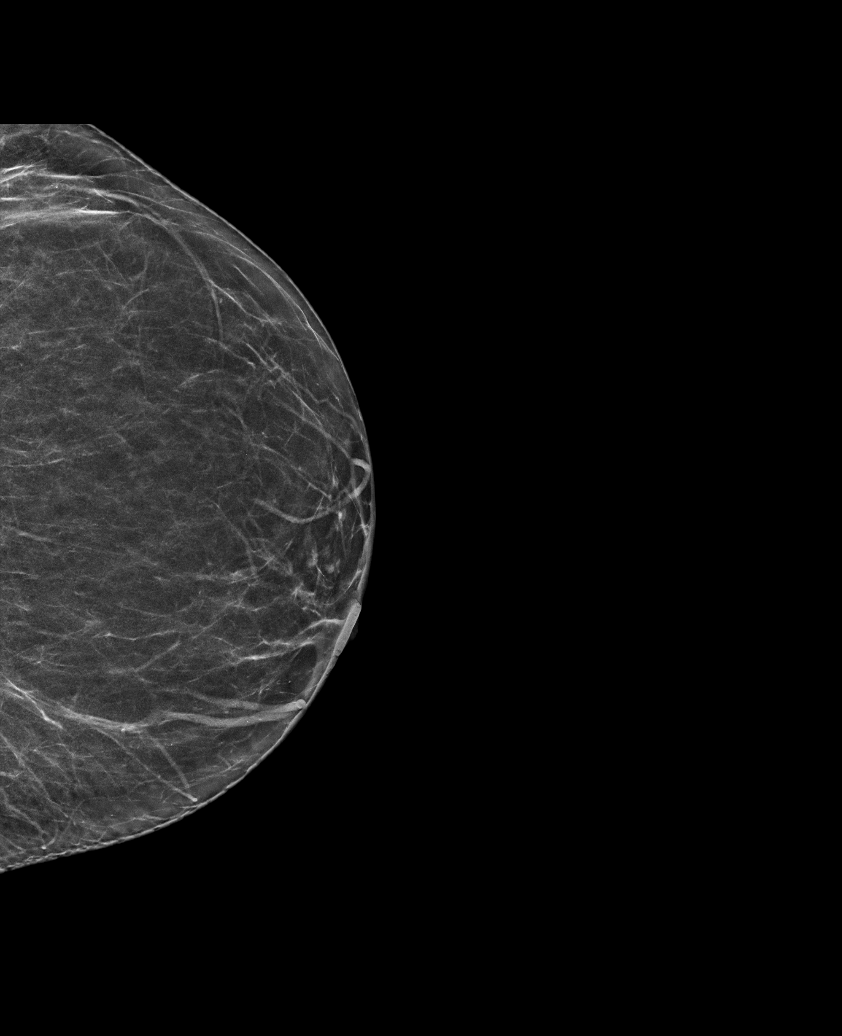

[R CC synth-2D (1 of 2)]
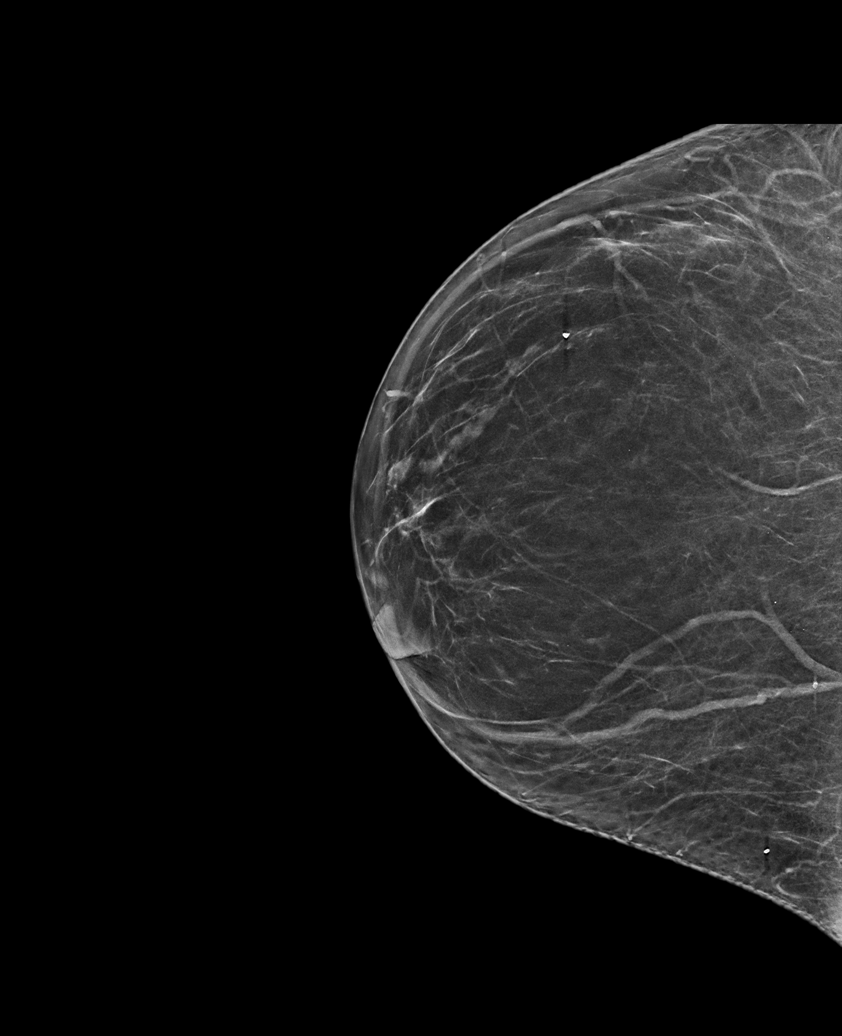

[L MLO synth-2D]
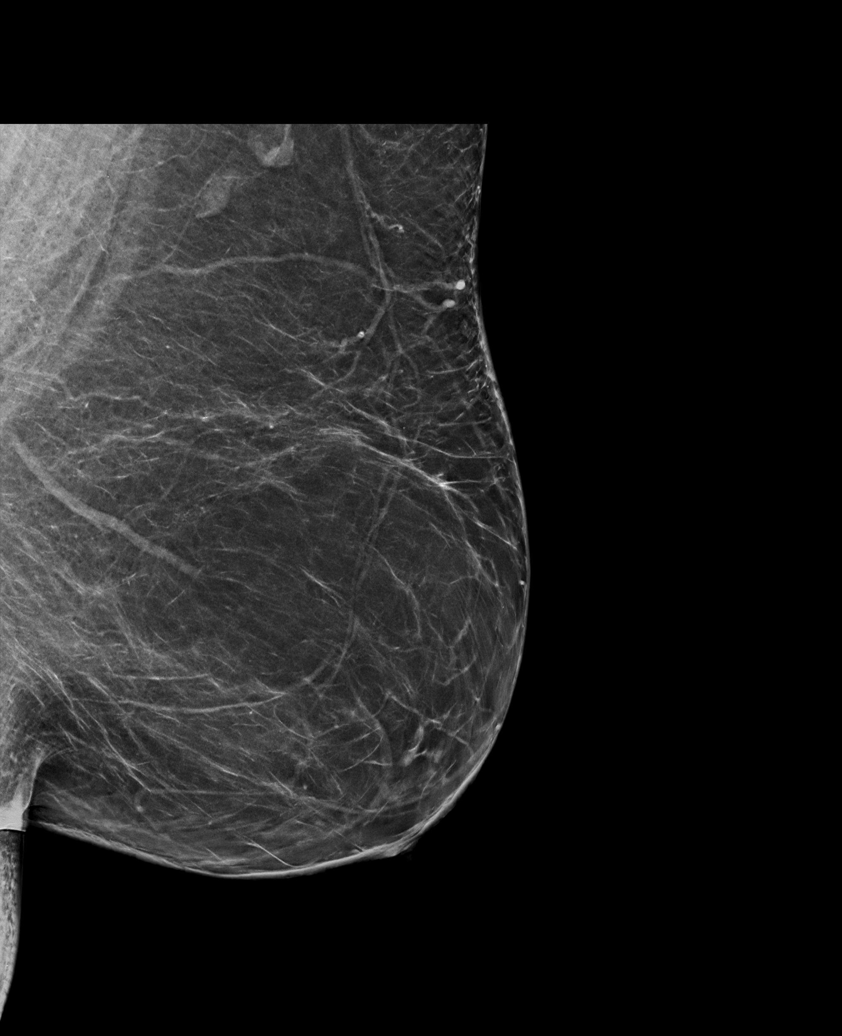

[R MLO synth-2D]
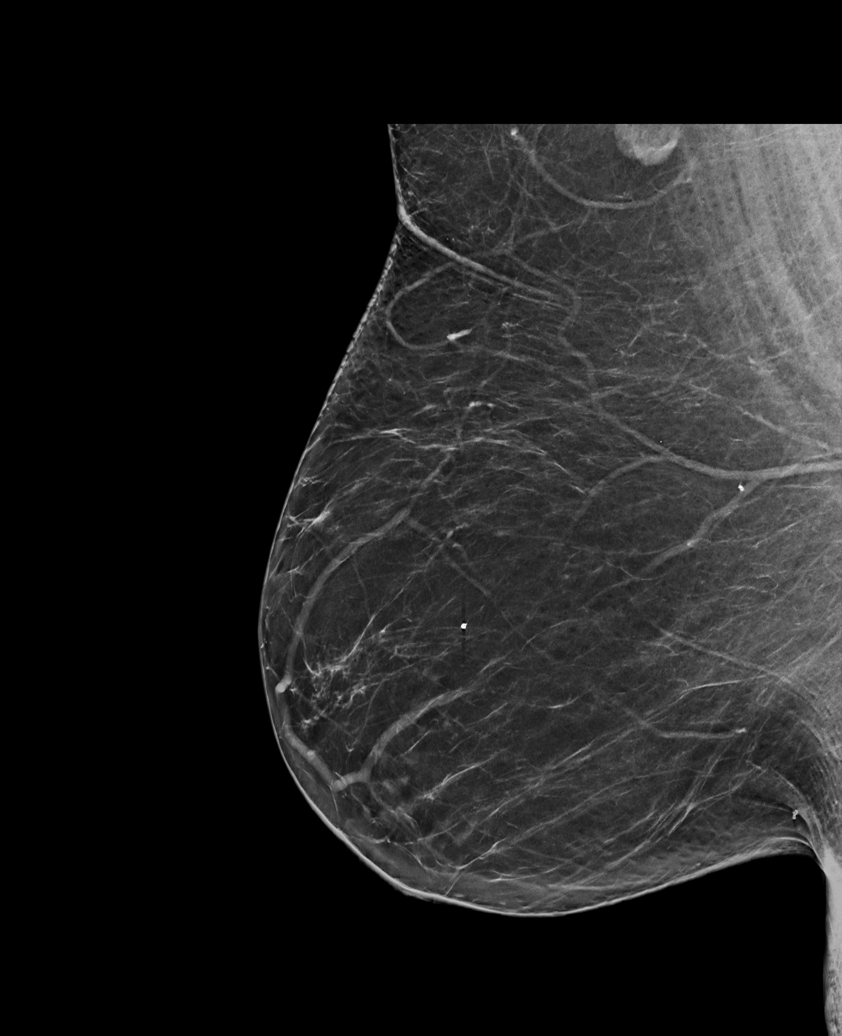

[R CC synth-2D (2 of 2)]
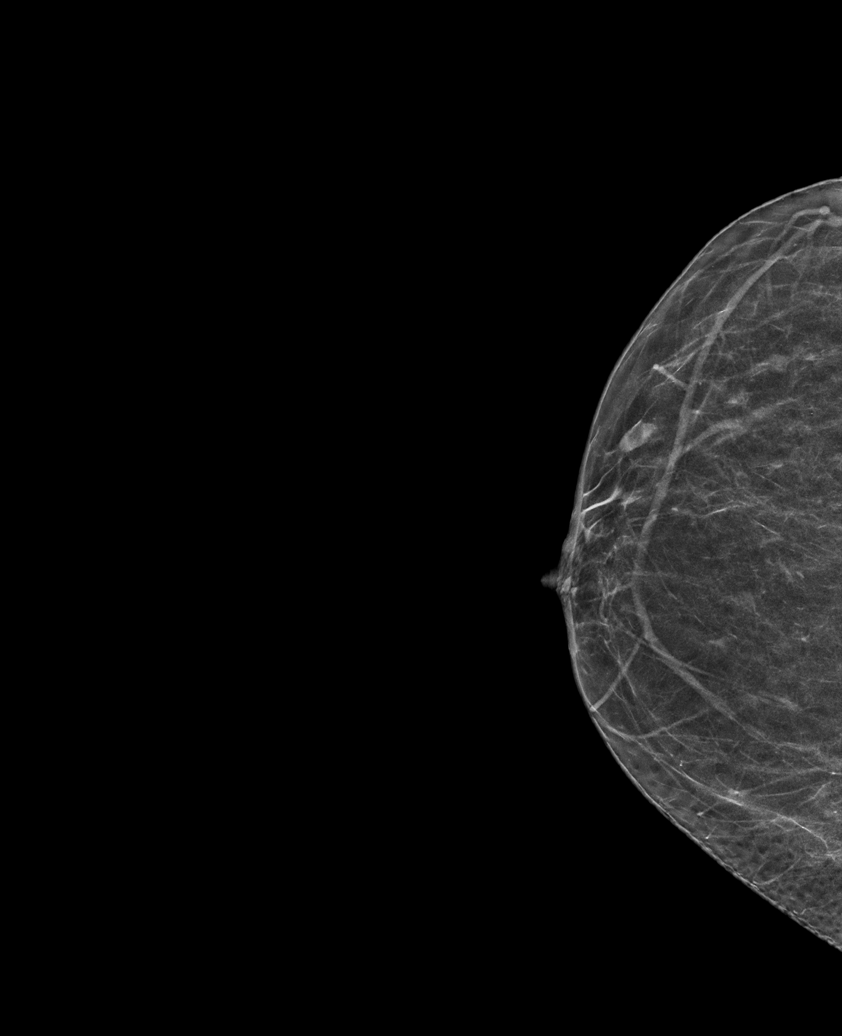

[6 of 36 positions shown; findings below may reference images not displayed]

ACR Breast Density Category b: There are scattered areas of
fibroglandular density.
FINDINGS: There are no findings suspicious for malignancy.
IMPRESSION: No mammographic evidence of malignancy. A result letter of this
screening mammogram will be mailed directly to the patient.

RECOMMENDATION:
Screening mammogram in one year. (Code:51-O-LD2)

BI-RADS CATEGORY  1: Negative.

## 2023-03-01 ENCOUNTER — Other Ambulatory Visit: Payer: Self-pay | Admitting: Family Medicine

## 2023-03-01 DIAGNOSIS — S0083XA Contusion of other part of head, initial encounter: Secondary | ICD-10-CM

## 2023-03-01 DIAGNOSIS — R519 Headache, unspecified: Secondary | ICD-10-CM

## 2023-03-08 ENCOUNTER — Ambulatory Visit
Admission: RE | Admit: 2023-03-08 | Discharge: 2023-03-08 | Disposition: A | Discharge status: Home / Self Care | Payer: 59 | Source: Ambulatory Visit | Attending: Family Medicine | Admitting: Family Medicine

## 2023-03-08 DIAGNOSIS — R519 Headache, unspecified: Secondary | ICD-10-CM | POA: Diagnosis present

## 2023-03-08 DIAGNOSIS — S0083XA Contusion of other part of head, initial encounter: Secondary | ICD-10-CM | POA: Insufficient documentation

## 2023-05-06 ENCOUNTER — Other Ambulatory Visit: Payer: Self-pay | Admitting: Family Medicine

## 2023-05-06 DIAGNOSIS — R7989 Other specified abnormal findings of blood chemistry: Secondary | ICD-10-CM

## 2023-05-12 ENCOUNTER — Ambulatory Visit
Admission: RE | Admit: 2023-05-12 | Discharge: 2023-05-12 | Disposition: A | Discharge status: Home / Self Care | Payer: 59 | Source: Ambulatory Visit | Attending: Family Medicine | Admitting: Family Medicine

## 2023-05-12 DIAGNOSIS — R7989 Other specified abnormal findings of blood chemistry: Secondary | ICD-10-CM | POA: Insufficient documentation

## 2023-09-06 ENCOUNTER — Other Ambulatory Visit: Payer: Self-pay | Admitting: Family Medicine

## 2023-09-06 DIAGNOSIS — Z1231 Encounter for screening mammogram for malignant neoplasm of breast: Secondary | ICD-10-CM

## 2023-09-28 ENCOUNTER — Ambulatory Visit
Admission: RE | Admit: 2023-09-28 | Discharge: 2023-09-28 | Disposition: A | Discharge status: Home / Self Care | Payer: 59 | Source: Ambulatory Visit | Attending: Family Medicine | Admitting: Family Medicine

## 2023-09-28 DIAGNOSIS — Z1231 Encounter for screening mammogram for malignant neoplasm of breast: Secondary | ICD-10-CM

## 2024-08-27 ENCOUNTER — Other Ambulatory Visit: Payer: Self-pay | Admitting: Family Medicine

## 2024-08-27 DIAGNOSIS — Z1231 Encounter for screening mammogram for malignant neoplasm of breast: Secondary | ICD-10-CM

## 2024-10-02 ENCOUNTER — Ambulatory Visit
Admission: RE | Admit: 2024-10-02 | Discharge: 2024-10-02 | Disposition: A | Discharge status: Home / Self Care | Source: Ambulatory Visit | Attending: Family Medicine | Admitting: Family Medicine

## 2024-10-02 DIAGNOSIS — Z1231 Encounter for screening mammogram for malignant neoplasm of breast: Secondary | ICD-10-CM

## 2024-12-06 ENCOUNTER — Other Ambulatory Visit: Payer: Self-pay | Admitting: Orthopedic Surgery

## 2024-12-20 ENCOUNTER — Other Ambulatory Visit: Payer: Self-pay

## 2024-12-20 ENCOUNTER — Inpatient Hospital Stay
Admission: RE | Admit: 2024-12-20 | Discharge: 2024-12-20 | Disposition: A | Source: Ambulatory Visit | Attending: Orthopedic Surgery

## 2024-12-20 VITALS — BP 132/78 | HR 72 | Resp 14 | Ht 69.0 in | Wt 270.0 lb

## 2024-12-20 DIAGNOSIS — E119 Type 2 diabetes mellitus without complications: Secondary | ICD-10-CM

## 2024-12-20 DIAGNOSIS — Z01818 Encounter for other preprocedural examination: Secondary | ICD-10-CM

## 2024-12-20 LAB — URINALYSIS, ROUTINE W REFLEX MICROSCOPIC
Bilirubin Urine: NEGATIVE
Glucose, UA: NEGATIVE mg/dL
Hgb urine dipstick: NEGATIVE
Ketones, ur: NEGATIVE mg/dL
Leukocytes,Ua: NEGATIVE
Nitrite: NEGATIVE
Protein, ur: NEGATIVE mg/dL
Specific Gravity, Urine: 1.006 (ref 1.005–1.030)
pH: 5 (ref 5.0–8.0)

## 2024-12-20 LAB — CBC WITH DIFFERENTIAL/PLATELET
Abs Immature Granulocytes: 0.02 10*3/uL (ref 0.00–0.07)
Basophils Absolute: 0.1 10*3/uL (ref 0.0–0.1)
Basophils Relative: 1 %
Eosinophils Absolute: 0.1 10*3/uL (ref 0.0–0.5)
Eosinophils Relative: 1 %
HCT: 33.8 % — ABNORMAL LOW (ref 36.0–46.0)
Hemoglobin: 10.7 g/dL — ABNORMAL LOW (ref 12.0–15.0)
Immature Granulocytes: 0 %
Lymphocytes Relative: 29 %
Lymphs Abs: 2.3 10*3/uL (ref 0.7–4.0)
MCH: 26.2 pg (ref 26.0–34.0)
MCHC: 31.7 g/dL (ref 30.0–36.0)
MCV: 82.8 fL (ref 80.0–100.0)
Monocytes Absolute: 0.6 10*3/uL (ref 0.1–1.0)
Monocytes Relative: 8 %
Neutro Abs: 4.9 10*3/uL (ref 1.7–7.7)
Neutrophils Relative %: 61 %
Platelets: 362 10*3/uL (ref 150–400)
RBC: 4.08 MIL/uL (ref 3.87–5.11)
RDW: 14.7 % (ref 11.5–15.5)
WBC: 7.9 10*3/uL (ref 4.0–10.5)
nRBC: 0 % (ref 0.0–0.2)

## 2024-12-20 LAB — SURGICAL PCR SCREEN
MRSA, PCR: NEGATIVE
Staphylococcus aureus: NEGATIVE

## 2024-12-20 LAB — COMPREHENSIVE METABOLIC PANEL WITH GFR
ALT: 14 U/L (ref 0–44)
AST: 35 U/L (ref 15–41)
Albumin: 4.1 g/dL (ref 3.5–5.0)
Alkaline Phosphatase: 53 U/L (ref 38–126)
Anion gap: 12 (ref 5–15)
BUN: 26 mg/dL — ABNORMAL HIGH (ref 6–20)
CO2: 23 mmol/L (ref 22–32)
Calcium: 9.6 mg/dL (ref 8.9–10.3)
Chloride: 103 mmol/L (ref 98–111)
Creatinine, Ser: 0.9 mg/dL (ref 0.44–1.00)
GFR, Estimated: >60 mL/min
Glucose, Bld: 73 mg/dL (ref 70–99)
Potassium: 4.1 mmol/L (ref 3.5–5.1)
Sodium: 138 mmol/L (ref 135–145)
Total Bilirubin: 0.4 mg/dL (ref 0.0–1.2)
Total Protein: 6.8 g/dL (ref 6.5–8.1)

## 2024-12-20 MED ORDER — SODIUM CHLORIDE 0.9 % IV SOLN
INTRAVENOUS | Status: DC
  Filled 2024-12-20: qty 150

## 2024-12-20 NOTE — Patient Instructions (Addendum)
 Your procedure is scheduled on: Thursday 12/27/24 To find out your arrival time, please call [phone] between 1PM - 3PM on: Wednesday 12/26/24 Report to the Registration Desk on the 1st floor of the [facility]. If your arrival time is 6:00 am, do not arrive before that time as the [facility] entrance doors do not open until 6:00 am.  REMEMBER: Instructions that are not followed completely may result in serious medical risk, up to and including death; or upon the discretion of your surgeon and anesthesiologist your surgery may need to be rescheduled.  Do not eat food after midnight the night before surgery.  No gum chewing or hard candies.  You may however, drink CLEAR liquids up to 2 hours before you are scheduled to arrive for your surgery. Do not drink anything within 2 hours of your scheduled arrival time.  Clear liquids include: - water  - apple juice without pulp - gatorade (not RED colors) - black coffee or tea (Do NOT add milk or creamers to the coffee or tea) Do NOT drink anything that is not on this list.  **Type 1 and Type 2 diabetics should only drink water.**  In addition, your doctor has ordered for you to drink the provided:   Gatorade G2 Drinking this  up to two hours before surgery helps to reduce insulin resistance and improve patient outcomes. Please complete drinking 2 hours before scheduled arrival time.  One week prior to surgery: Stop Anti-inflammatories (NSAIDS) such as Advil, Aleve, Ibuprofen, Motrin, Naproxen, Naprosyn and Aspirin based products such as Excedrin, Goody's Powder, BC Powder.  You may however, continue to take Tylenol if needed for pain up until the day of surgery.  Stop ANY OVER THE COUNTER supplements and vitamins for at least 7 days until after surgery.  **Follow guidelines for insulin and diabetes medications.** Metformin last dose before surgery will be Monday 12/24/24. Skip your next scheduled dose of Mounjaro on Sunday 12/23/24, can  restart after surgery.  Continue taking all of your other prescription medications up until the day of surgery.  ON THE DAY OF SURGERY ONLY TAKE THESE MEDICATIONS WITH SIPS OF WATER:  metoprolol succinate (TOPROL-XL) 50 MG 24 hr tablet   No Alcohol for 24 hours before or after surgery.  No Smoking including e-cigarettes for 24 hours before surgery.  No chewable tobacco products for at least 6 hours before surgery.  No nicotine patches on the day of surgery.  Do not use any recreational drugs for at least a week (preferably 2 weeks) before your surgery.  Please be advised that the combination of cocaine and anesthesia may have negative outcomes, up to and including death. If you test positive for cocaine, your surgery will be cancelled.  On the morning of surgery brush your teeth with toothpaste and water, you may rinse your mouth with mouthwash if you wish. Do not swallow any toothpaste or mouthwash.  Use CHG Soap or wipes as directed on instruction sheet. (You can pick this up at our office in the [facility], the building to the left of the [employer], [address] at [address]  Do not shave body hair from the neck down once you start your CHG showers  Do not wear lotions, powders, or perfumes after our last shower   Wear comfortable clothing (specific to your surgery type) to the hospital.  Do not wear jewelry, make-up, hairpins, clips or nail polish.  For welded (permanent) jewelry: bracelets, anklets,  waist bands, etc.  Please have this removed prior to surgery.  If it is not removed, there is a chance that hospital personnel will need to cut it off on the day of surgery.  Contact lenses, hearing aids and dentures may not be worn into surgery. Bring a case for your glasses  Do not bring valuables to the hospital. [facility] is not responsible for any missing/lost belongings or valuables.   Notify your doctor if there is any change in your  medical condition (cold, fever, infection).  After surgery, you can help prevent lung complications by doing breathing exercises.  Take deep breaths and cough every 1-2 hours. Your doctor may order a device called an Incentive Spirometer to help you take deep breaths.  If you are being admitted to the hospital overnight, leave your suitcase in the car. After surgery it may be brought to your room.  In case of increased patient census, it may be necessary for you, the patient, to continue your postoperative care in the Same Day Surgery department.  Please call the Pre-admissions Testing Dept. at [phone] if you have any questions about these instructions.  Surgery Visitation Policy:  Patients having surgery or a procedure may have two visitors.  Children under the age of 30 must have an adult with them who is not the patient.  Inpatient Visitation:    Visiting hours are 7 a.m. to 8 p.m. Up to four visitors are allowed at one time in a patient room. The visitors may rotate out with other people during the day.  One visitor age 73 or older may stay with the patient overnight and must be in the room by 8 p.m.   Merchandiser, Retail to address health-related social needs:  [email or web address]    Pre-operative 4 CHG Bath Instructions   You can play a key role in reducing the risk of infection after surgery. Your skin needs to be as free of germs as possible. You can reduce the number of germs on your skin by washing with CHG (chlorhexidine gluconate) soap before surgery. CHG is an antiseptic soap that kills germs and continues to kill germs even after washing.   DO NOT use if you have an allergy to chlorhexidine/CHG or antibacterial soaps. If your skin becomes reddened or irritated, stop using the CHG and notify one of our RNs at [phone].   Please shower with the CHG soap starting 4 days before surgery using the following schedule:   Sunday 12/23/24 -  Wednesday 12/26/24    Please keep in mind the following:  DO NOT shave, including legs and underarms, starting the day of your first shower.   You may shave your face at any point before/day of surgery.  Place clean sheets on your bed the day you start using CHG soap. Use a clean washcloth (not used since being washed) for each shower. DO NOT sleep with pets once you start using the CHG.   CHG Shower Instructions:  If you choose to wash your hair and private area, wash first with your normal shampoo/soap.  After you use shampoo/soap, rinse your hair and body thoroughly to remove shampoo/soap residue.  Turn the water OFF and apply about 3 tablespoons (45 ml) of CHG soap to a CLEAN washcloth.  Apply CHG soap ONLY FROM YOUR NECK DOWN TO YOUR TOES (washing for 3-5 minutes)  DO NOT use CHG soap on face, private areas, open wounds, or sores.  Pay special attention to the area where  your surgery is being performed.  If you are having back surgery, having someone wash your back for you may be helpful. Wait 2 minutes after CHG soap is applied, then you may rinse off the CHG soap.  Pat dry with a clean towel  Put on clean clothes/pajamas   If you choose to wear lotion, please use ONLY the CHG-compatible lotions on the back of this paper.     Additional instructions for the day of surgery: DO NOT APPLY any lotions, deodorants, cologne, or perfumes.   Put on clean/comfortable clothes.  Brush your teeth.  Ask your nurse before applying any prescription medications to the skin.      CHG Compatible Lotions   Aveeno Moisturizing lotion  Cetaphil Moisturizing Cream  Cetaphil Moisturizing Lotion  Clairol Herbal Essence Moisturizing Lotion, Dry Skin  Clairol Herbal Essence Moisturizing Lotion, Extra Dry Skin  Clairol Herbal Essence Moisturizing Lotion, Normal Skin  Curel Age Defying Therapeutic Moisturizing Lotion with Alpha Hydroxy  Curel Extreme Care Body Lotion  Curel Soothing Hands  Moisturizing Hand Lotion  Curel Therapeutic Moisturizing Cream, Fragrance-Free  Curel Therapeutic Moisturizing Lotion, Fragrance-Free  Curel Therapeutic Moisturizing Lotion, Original Formula  Eucerin Daily Replenishing Lotion  Eucerin Dry Skin Therapy Plus Alpha Hydroxy Crme  Eucerin Dry Skin Therapy Plus Alpha Hydroxy Lotion  Eucerin Original Crme  Eucerin Original Lotion  Eucerin Plus Crme Eucerin Plus Lotion  Eucerin TriLipid Replenishing Lotion  Keri Anti-Bacterial Hand Lotion  Keri Deep Conditioning Original Lotion Dry Skin Formula Softly Scented  Keri Deep Conditioning Original Lotion, Fragrance Free Sensitive Skin Formula  Keri Lotion Fast Absorbing Fragrance Free Sensitive Skin Formula  Keri Lotion Fast Absorbing Softly Scented Dry Skin Formula  Keri Original Lotion  Keri Skin Renewal Lotion Keri Silky Smooth Lotion  Keri Silky Smooth Sensitive Skin Lotion  Nivea Body Creamy Conditioning Oil  Nivea Body Extra Enriched Lotion  Nivea Body Original Lotion  Nivea Body Sheer Moisturizing Lotion Nivea Crme  Nivea Skin Firming Lotion  NutraDerm 30 Skin Lotion  NutraDerm Skin Lotion  NutraDerm Therapeutic Skin Cream  NutraDerm Therapeutic Skin Lotion  ProShield Protective Hand Cream  Provon moisturizing lotion  How to Use an Incentive Spirometer  An incentive spirometer is a tool that measures how well you are filling your lungs with each breath. Learning to take long, deep breaths using this tool can help you keep your lungs clear and active. This may help to reverse or lessen your chance of developing breathing (pulmonary) problems, especially infection. You may be asked to use a spirometer: After a surgery. If you have a lung problem or a history of smoking. After a long period of time when you have been unable to move or be active. If the spirometer includes an indicator to show the highest number that you have reached, your health care provider or respiratory therapist  will help you set a goal. Keep a log of your progress as told by your health care provider. What are the risks? Breathing too quickly may cause dizziness or cause you to pass out. Take your time so you do not get dizzy or light-headed. If you are in pain, you may need to take pain medicine before doing incentive spirometry. It is harder to take a deep breath if you are having pain.  Sit up on the edge of your bed or on a chair. Hold the incentive spirometer so that it is in an upright position. Before you use the spirometer, breathe out normally. Place the  mouthpiece in your mouth. Make sure your lips are closed tightly around it. Breathe in slowly and as deeply as you can through your mouth, causing the piston or the ball to rise toward the top of the chamber. Hold your breath for 3-5 seconds, or for as long as possible. If the spirometer includes a coach indicator, use this to guide you in breathing. Slow down your breathing if the indicator goes above the marked areas. Remove the mouthpiece from your mouth and breathe out normally. The piston or ball will return to the bottom of the chamber. Rest for a few seconds, then repeat the steps 10 or more times. Take your time and take a few normal breaths between deep breaths so that you do not get dizzy or light-headed. Do this every 1-2 hours when you are awake. If the spirometer includes a goal marker to show the highest number you have reached (best effort), use this as a goal to work toward during each repetition. After each set of 10 deep breaths, cough a few times. This will help to make sure that your lungs are clear. If you have an incision on your chest or abdomen from surgery, place a pillow or a rolled-up towel firmly against the incision when you cough. This can help to reduce pain while taking deep breaths and coughing. General tips When you are able to get out of bed: Walk around often. Continue to take deep breaths and cough in  order to clear your lungs. Keep using the incentive spirometer until your health care provider says it is okay to stop using it. If you have been in the hospital, you may be told to keep using the spirometer at home. Contact a health care provider if: You are having difficulty using the spirometer. You have trouble using the spirometer as often as instructed. Your pain medicine is not giving enough relief for you to use the spirometer as told. You have a fever. Get help right away if: You develop shortness of breath. You develop a cough with bloody mucus from the lungs. You have fluid or blood coming from an incision site after you cough. Summary An incentive spirometer is a tool that can help you learn to take long, deep breaths to keep your lungs clear and active. You may be asked to use a spirometer after a surgery, if you have a lung problem or a history of smoking, or if you have been inactive for a long period of time. Use your incentive spirometer as instructed every 1-2 hours while you are awake. If you have an incision on your chest or abdomen, place a pillow or a rolled-up towel firmly against your incision when you cough. This will help to reduce pain. Get help right away if you have shortness of breath, you cough up bloody mucus, or blood comes from your incision when you cough. This information is not intended to replace advice given to you by your health care provider. Make sure you discuss any questions you have with your health care provider. Document Revised: 01/21/2020 Document Reviewed: 01/21/2020 Elsevier Patient Education  2023 [employer].

## 2024-12-27 ENCOUNTER — Ambulatory Visit: Admission: RE | Admit: 2024-12-27 | Source: Home / Self Care | Admitting: Orthopedic Surgery

## 2024-12-27 ENCOUNTER — Encounter: Admission: RE | Payer: Self-pay | Source: Home / Self Care

## 2024-12-27 SURGERY — ARTHROPLASTY, KNEE, TOTAL
Anesthesia: Choice | Site: Knee | Laterality: Left
